# Patient Record
Sex: Male | Born: 1935 | Race: White | Hispanic: No | Marital: Married | Smoking: Former smoker
Health system: Southern US, Community
[De-identification: ages and names within clinical notes are randomized; demographics above are authoritative.]

## PROBLEM LIST (undated history)

## (undated) DIAGNOSIS — C801 Malignant (primary) neoplasm, unspecified: Secondary | ICD-10-CM

## (undated) DIAGNOSIS — E7800 Pure hypercholesterolemia, unspecified: Secondary | ICD-10-CM

## (undated) DIAGNOSIS — I10 Essential (primary) hypertension: Secondary | ICD-10-CM

---

## 2012-11-11 ENCOUNTER — Emergency Department (HOSPITAL_COMMUNITY)
Admission: EM | Admit: 2012-11-11 | Discharge: 2012-11-11 | Disposition: A | Discharge status: Home / Self Care | Payer: Medicare Other | Attending: Emergency Medicine | Admitting: Emergency Medicine

## 2012-11-11 ENCOUNTER — Encounter (HOSPITAL_COMMUNITY): Payer: Self-pay | Admitting: Emergency Medicine

## 2012-11-11 DIAGNOSIS — Z7982 Long term (current) use of aspirin: Secondary | ICD-10-CM | POA: Insufficient documentation

## 2012-11-11 DIAGNOSIS — I10 Essential (primary) hypertension: Secondary | ICD-10-CM | POA: Insufficient documentation

## 2012-11-11 DIAGNOSIS — Z880 Allergy status to penicillin: Secondary | ICD-10-CM | POA: Insufficient documentation

## 2012-11-11 DIAGNOSIS — R040 Epistaxis: Secondary | ICD-10-CM | POA: Insufficient documentation

## 2012-11-11 DIAGNOSIS — E7800 Pure hypercholesterolemia, unspecified: Secondary | ICD-10-CM | POA: Insufficient documentation

## 2012-11-11 DIAGNOSIS — Z79899 Other long term (current) drug therapy: Secondary | ICD-10-CM | POA: Insufficient documentation

## 2012-11-11 DIAGNOSIS — Z859 Personal history of malignant neoplasm, unspecified: Secondary | ICD-10-CM | POA: Insufficient documentation

## 2012-11-11 HISTORY — DX: Essential (primary) hypertension: I10

## 2012-11-11 HISTORY — DX: Pure hypercholesterolemia, unspecified: E78.00

## 2012-11-11 HISTORY — DX: Malignant (primary) neoplasm, unspecified: C80.1

## 2012-11-11 NOTE — ED Notes (Signed)
Pt ambulated to bathroom without difficulty, no epistaxis noted.

## 2012-11-11 NOTE — ED Notes (Signed)
States he started having a right sided nose bleed about 30 minutes prior to arrival.  Direct pressure instructions given to pt during triage and pressure applied to nose.

## 2012-11-11 NOTE — ED Provider Notes (Signed)
History  This chart was scribed for Angel Hutching, MD by Bennett Scrape, ED Scribe. This patient was seen in room APA01/APA01 and the patient's care was started at 6:40 PM.  CSN: [id number]  Arrival date & time 11/11/12  1751   First MD Initiated Contact with Patient 11/11/12 1810      Chief Complaint  Patient presents with  . Epistaxis     The history is provided by the patient. No language interpreter was used.    HPI Comments: Delbert Darley is a 79 y.o. male who presents to the Emergency Department complaining of one episode of sudden onset, gradually improving, constant, mild epistaxis from the right nare that started one hour ago while the pt was sitting. He has a tissue applying pressure to the nose with less than 25% blood coverage on the tissue. He denies swallowing any of the nosebleed. He denies trouble breathing or swallowing and nausea as associated symptoms. He has a h/o HTN, CA and HLD.   Past Medical History  Diagnosis Date  . High cholesterol   . Hypertension   . Cancer     History reviewed. No pertinent past surgical history.  No family history on file.  History  Substance Use Topics  . Smoking status: Not on file  . Smokeless tobacco: Not on file  . Alcohol Use: Not on file      Review of Systems  A complete 10 system review of systems was obtained and all systems are negative except as noted in the HPI and PMH.   Allergies  Penicillins  Home Medications   Current Outpatient Rx  Name  Route  Sig  Dispense  Refill  . aspirin EC 81 MG tablet   Oral   Take 81 mg by mouth every morning.         . cholecalciferol (VITAMIN D) 1000 UNITS tablet   Oral   Take 1,000 Units by mouth every morning.         . folic acid (FOLVITE) 800 MCG tablet   Oral   Take 800 mcg by mouth every morning.         Marland Kitchen ibuprofen (ADVIL,MOTRIN) 200 MG tablet   Oral   Take 200 mg by mouth daily as needed for pain.         . niacin 500 MG CR capsule   Oral  Take 500 mg by mouth at bedtime.         Marland Kitchen NIFEdipine (PROCARDIA-XL/ADALAT CC) 60 MG 24 hr tablet   Oral   Take 60 mg by mouth every morning.         . simvastatin (ZOCOR) 20 MG tablet   Oral   Take 20 mg by mouth every evening.           Triage Vitals: BP 166/82  Pulse 111  Resp 16  Ht 5\' 8"  (1.727 m)  Wt 162 lb (73.483 kg)  BMI 24.64 kg/m2  SpO2 96%  Physical Exam  Nursing note and vitals reviewed. Constitutional: He is oriented to person, place, and time. He appears well-developed and well-nourished. No distress.  HENT:  Head: Normocephalic and atraumatic.  Well established clot to the anterior lateral aspect to the right nare, left nare is clear, Ecchymosis to the corner of the right upper eyelid  Eyes: EOM are normal.  Neck: Neck supple. No tracheal deviation present.  Cardiovascular: Normal rate.   Pulmonary/Chest: Effort normal. No respiratory distress.  Musculoskeletal: Normal range of motion.  Neurological:  He is alert and oriented to person, place, and time.  Skin: Skin is warm and dry.  Psychiatric: He has a normal mood and affect. His behavior is normal.    ED Course  Procedures (including critical care time)  DIAGNOSTIC STUDIES: Oxygen Saturation is 96% on room air, normal by my interpretation.    COORDINATION OF CARE: 6:48 PM-Discussed treatment plan which includes applying ice to help slow the bleeding with pt at bedside and pt agreed to plan.   Labs Reviewed - No data to display No results found.   No diagnosis found.    MDM  Good hemostasis in right nostril.  No aggressive intervention necessary  I personally performed the services described in this documentation, which was scribed in my presence. The recorded information has been reviewed and is accurate.        Angel Hutching, MD 11/12/12 [phone]

## 2012-11-11 NOTE — ED Notes (Signed)
Pt presents with bilateral nose bleed x 1 hour. Bleeding controlled at this time.  Pt reports sitting at a production, when he felt his nose "dripping". Pt reports he has never had a nose bleed before. Denies hypertension at this time. NAD noted

## 2018-06-21 ENCOUNTER — Other Ambulatory Visit: Payer: Self-pay

## 2018-06-21 ENCOUNTER — Emergency Department (HOSPITAL_COMMUNITY)
Admission: EM | Admit: 2018-06-21 | Discharge: 2018-06-21 | Disposition: A | Discharge status: Home / Self Care | Payer: Medicare Other | Attending: Emergency Medicine | Admitting: Emergency Medicine

## 2018-06-21 ENCOUNTER — Encounter (HOSPITAL_COMMUNITY): Payer: Self-pay | Admitting: Emergency Medicine

## 2018-06-21 ENCOUNTER — Emergency Department (HOSPITAL_COMMUNITY): Payer: Medicare Other

## 2018-06-21 DIAGNOSIS — Z79899 Other long term (current) drug therapy: Secondary | ICD-10-CM | POA: Insufficient documentation

## 2018-06-21 DIAGNOSIS — I10 Essential (primary) hypertension: Secondary | ICD-10-CM | POA: Insufficient documentation

## 2018-06-21 DIAGNOSIS — R079 Chest pain, unspecified: Secondary | ICD-10-CM | POA: Diagnosis present

## 2018-06-21 DIAGNOSIS — R0789 Other chest pain: Secondary | ICD-10-CM | POA: Diagnosis not present

## 2018-06-21 DIAGNOSIS — Z87891 Personal history of nicotine dependence: Secondary | ICD-10-CM | POA: Insufficient documentation

## 2018-06-21 MED ORDER — ACETAMINOPHEN 325 MG PO TABS
ORAL_TABLET | ORAL | Status: AC
  Administered 2018-06-21: 650 mg via ORAL
  Filled 2018-06-21: qty 2

## 2018-06-21 MED ORDER — ACETAMINOPHEN 325 MG PO TABS: 650.0000 mg | ORAL_TABLET | Freq: Once | ORAL | Status: AC
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
## 2018-06-21 NOTE — Discharge Instructions (Signed)
You may take tylenol for pain. Go to your doctor or Return to the ED if you develop worsening chest pain or shortness of breath.

## 2019-08-11 ENCOUNTER — Other Ambulatory Visit: Payer: Self-pay

## 2019-08-11 ENCOUNTER — Ambulatory Visit
Admission: EM | Admit: 2019-08-11 | Discharge: 2019-08-11 | Disposition: A | Discharge status: Home / Self Care | Payer: Medicare Other | Attending: Emergency Medicine | Admitting: Emergency Medicine

## 2019-08-11 DIAGNOSIS — Z20822 Contact with and (suspected) exposure to covid-19: Secondary | ICD-10-CM

## 2019-08-11 DIAGNOSIS — U071 COVID-19: Secondary | ICD-10-CM

## 2019-08-11 LAB — POC SARS CORONAVIRUS 2 AG -  ED: SARS Coronavirus 2 Ag: POSITIVE — AB

## 2019-08-11 MED ORDER — BENZONATATE 100 MG PO CAPS: 100.0000 mg | ORAL_CAPSULE | Freq: Three times a day (TID) | ORAL | 0 refills | Status: DC

## 2019-08-11 MED ORDER — FLUTICASONE PROPIONATE 50 MCG/ACT NA SUSP: 1.0000 | Freq: Every day | NASAL | 0 refills | Status: AC

## 2019-08-11 MED ORDER — PREDNISONE 10 MG PO TABS: 20.0000 mg | ORAL_TABLET | Freq: Every day | ORAL | 0 refills | Status: DC

## 2019-08-11 NOTE — Discharge Instructions (Addendum)
POC COVID-19 test was positive In the meantime: You should remain isolated in your home for 10 days from symptom onset AND greater than 72 hours after symptoms resolution (absence of fever without the use of fever-reducing medication and improvement in respiratory symptoms), whichever is longer Get plenty of rest and push fluids Use medications daily for symptom relief Use OTC medications like ibuprofen or tylenol as needed fever or pain Call or go to the ED if you have any new or worsening symptoms such as fever, worsening cough, shortness of breath, chest tightness, chest pain, turning blue, changes in mental status, etc..Marland Kitchen

## 2019-08-11 NOTE — ED Triage Notes (Addendum)
Pt presents to UC w/ c/o congestion and productive cough x1week. Pt also states he's been having O2 sats from 88-92%. Pt requests covid test. Denies exposure.

## 2019-08-11 NOTE — ED Provider Notes (Signed)
[facility]    CSN: [id number] Arrival date & time: 08/11/19  1005      History   Chief Complaint Chief Complaint  Patient presents with  . congestion    HPI Angel Small is a 86 y.o. male.    Presented to the urgent care with a complaint of congestion, productive cough for the past week.  Denies sick exposure to COVID, flu or strep.  Denies recent travel.  Denies aggravating or alleviating symptoms.  Denies previous COVID infection.   Denies fever, chills, fatigue,  rhinorrhea, sore throat, SOB, wheezing, chest pain, nausea, vomiting, changes in bowel or bladder habits.    The history is provided by the patient. No language interpreter was used.    Past Medical History:  Diagnosis Date  . Cancer ([place])   . High cholesterol   . Hypertension     There are no problems to display for this patient.   History reviewed. No pertinent surgical history.     Home Medications    Prior to Admission medications   Medication Sig Start Date End Date Taking? Authorizing Provider  acetaminophen (TYLENOL) 500 MG tablet Take 500 mg by mouth daily as needed for mild pain or moderate pain.    [provider]  amLODipine (NORVASC) 10 MG tablet Take 10 mg by mouth daily. 03/16/18   [provider]  azelastine (OPTIVAR) 0.05 % ophthalmic solution Apply 1 drop to eye 2 (two) times daily as needed. 05/01/18   [provider]  benzonatate (TESSALON) 100 MG capsule Take 1 capsule (100 mg total) by mouth every 8 (eight) hours. 08/11/19   Ranita Stjulien, Darrelyn Hillock, FNP  cholecalciferol (VITAMIN D) 1000 UNITS tablet Take 1,000 Units by mouth every morning.    [provider]  fluticasone (FLONASE) 50 MCG/ACT nasal spray Place 1 spray into both nostrils daily for 14 days. 08/11/19 08/25/19  Khamari Yousuf, Darrelyn Hillock, FNP  folic acid (FOLVITE) Q000111Q MCG tablet Take 800 mcg by mouth every morning.    [provider]  hydrochlorothiazide (HYDRODIURIL) 25 MG tablet  Take 25 mg by mouth daily. 05/04/18   [provider]  Multiple Vitamins-Minerals (ICAPS) TABS Take 1 tablet by mouth 2 (two) times daily.    [provider]  predniSONE (DELTASONE) 10 MG tablet Take 2 tablets (20 mg total) by mouth daily. 08/11/19   Sherae Santino, Darrelyn Hillock, FNP  sertraline (ZOLOFT) 50 MG tablet Take 50 mg by mouth daily. 05/29/18   [provider]  simvastatin (ZOCOR) 20 MG tablet Take 20 mg by mouth every evening.    [provider]    Family History Family History  Problem Relation Age of Onset  . Healthy Mother   . Healthy Father     Social History Social History   Tobacco Use  . Smoking status: Former Research scientist (life sciences)  . Smokeless tobacco: Never Used  Substance Use Topics  . Alcohol use: Not Currently  . Drug use: Never     Allergies   Penicillins   Review of Systems Review of Systems  Constitutional: Negative.   HENT: Positive for congestion.   Respiratory: Positive for cough.   Cardiovascular: Negative.   Gastrointestinal: Negative.   Neurological: Negative.      Physical Exam Triage Vital Signs ED Triage Vitals  Enc Vitals Group     BP 08/11/19 1015 125/66     Pulse Rate 08/11/19 1015 82     Resp 08/11/19 1015 18     Temp 08/11/19 1015  98.3 F (36.8 C)     Temp Source 08/11/19 1015 Oral     SpO2 08/11/19 1015 90 %     Weight --      Height --      Head Circumference --      Peak Flow --      Pain Score 08/11/19 1018 0     Pain Loc --      Pain Edu? --      Excl. in [place]? --    No data found.  Updated Vital Signs BP 125/66 (BP Location: Right Arm)   Pulse 82   Temp 98.3 F (36.8 C) (Oral)   Resp 18   SpO2 90%   Visual Acuity Right Eye Distance:   Left Eye Distance:   Bilateral Distance:    Right Eye Near:   Left Eye Near:    Bilateral Near:     Physical Exam Vitals and nursing note reviewed.  Constitutional:      General: He is not in acute distress.    Appearance: Normal appearance. He is  normal weight. He is not ill-appearing or toxic-appearing.  HENT:     Head: Normocephalic.     Right Ear: Tympanic membrane, ear canal and external ear normal. There is no impacted cerumen.     Left Ear: Tympanic membrane, ear canal and external ear normal. There is no impacted cerumen.     Nose: Nose normal. No congestion.     Mouth/Throat:     Mouth: Mucous membranes are moist.     Pharynx: Oropharynx is clear. No oropharyngeal exudate or posterior oropharyngeal erythema.  Cardiovascular:     Rate and Rhythm: Normal rate and regular rhythm.     Pulses: Normal pulses.     Heart sounds: Normal heart sounds. No murmur.  Pulmonary:     Effort: Pulmonary effort is normal. No respiratory distress.     Breath sounds: Normal breath sounds. No wheezing or rhonchi.  Chest:     Chest wall: No tenderness.  Abdominal:     General: Abdomen is flat. Bowel sounds are normal. There is no distension.     Palpations: There is no mass.     Tenderness: There is no abdominal tenderness.  Skin:    Capillary Refill: Capillary refill takes less than 2 seconds.  Neurological:     General: No focal deficit present.     Mental Status: He is alert and oriented to person, place, and time.      UC Treatments / Results  Labs (all labs ordered are listed, but only abnormal results are displayed) Labs Reviewed  POC SARS CORONAVIRUS 2 AG -  ED - Abnormal; Notable for the following components:      Result Value   SARS Coronavirus 2 Ag Positive (*)    All other components within normal limits    EKG   Radiology No results found.  Procedures Procedures (including critical care time)  Medications Ordered in UC Medications - No data to display  Initial Impression / Assessment and Plan / UC Course  I have reviewed the triage vital signs and the nursing notes.  Pertinent labs & imaging results that were available during my care of the patient were reviewed by me and considered in my medical decision  making (see chart for details).    POC COVID-19 test was ordered.  Results were positive for COVID-19 infection Advised patient to quarantine. Tessalon Perles prescribed for cough. Flonase for congestion. Prednisone for  inflammation. Advised patient to go to ED for worsening of symptoms  Final Clinical Impressions(s) / UC Diagnoses   Final diagnoses:  COVID-19 virus infection     Discharge Instructions     POC COVID-19 test was positive In the meantime: You should remain isolated in your home for 10 days from symptom onset AND greater than 72 hours after symptoms resolution (absence of fever without the use of fever-reducing medication and improvement in respiratory symptoms), whichever is longer Get plenty of rest and push fluids Use medications daily for symptom relief Use OTC medications like ibuprofen or tylenol as needed fever or pain Call or go to the ED if you have any new or worsening symptoms such as fever, worsening cough, shortness of breath, chest tightness, chest pain, turning blue, changes in mental status, etc...     ED Prescriptions    Medication Sig Dispense Auth. Provider   predniSONE (DELTASONE) 10 MG tablet Take 2 tablets (20 mg total) by mouth daily. 15 tablet Janika Jedlicka, Darrelyn Hillock, FNP   benzonatate (TESSALON) 100 MG capsule Take 1 capsule (100 mg total) by mouth every 8 (eight) hours. 21 capsule Kerry Odonohue S, FNP   fluticasone (FLONASE) 50 MCG/ACT nasal spray Place 1 spray into both nostrils daily for 14 days. 16 g Emerson Monte, FNP     PDMP not reviewed this encounter.   Emerson Monte, FNP 08/11/19 1052

## 2019-08-12 ENCOUNTER — Telehealth: Payer: Self-pay | Admitting: Unknown Physician Specialty

## 2019-08-12 NOTE — Telephone Encounter (Signed)
Called r/e mab infusion for Covid 19.  Unable to reach pt at this number.

## 2019-08-13 ENCOUNTER — Other Ambulatory Visit: Payer: Self-pay | Admitting: Nurse Practitioner

## 2019-08-13 NOTE — Progress Notes (Signed)
  I connected by phone with Angel Small on 08/13/2019 at 2:23 PM to discuss the potential use of an new treatment for mild to moderate COVID-19 viral infection in non-hospitalized patients.  This patient is a 86 y.o. male that meets the FDA criteria for Emergency Use Authorization of bamlanivimab or casirivimab\imdevimab.  Has a (+) direct SARS-CoV-2 viral test result  Has mild or moderate COVID-19   Is ? 84 years of age and weighs ? 40 kg  Is NOT hospitalized due to COVID-19  Is NOT requiring oxygen therapy or requiring an increase in baseline oxygen flow rate due to COVID-19  Is within 10 days of symptom onset  Has at least one of the high risk factor(s) for progression to severe COVID-19 and/or hospitalization as defined in EUA.  Specific high risk criteria : >/= 84 yo   I have spoken and communicated the following to the patient or parent/caregiver:  1. FDA has authorized the emergency use of bamlanivimab and casirivimab\imdevimab for the treatment of mild to moderate COVID-19 in adults and pediatric patients with positive results of direct SARS-CoV-2 viral testing who are 28 years of age and older weighing at least 40 kg, and who are at high risk for progressing to severe COVID-19 and/or hospitalization.  2. The significant known and potential risks and benefits of bamlanivimab and casirivimab\imdevimab, and the extent to which such potential risks and benefits are unknown.  3. Information on available alternative treatments and the risks and benefits of those alternatives, including clinical trials.  4. Patients treated with bamlanivimab and casirivimab\imdevimab should continue to self-isolate and use infection control measures (e.g., wear mask, isolate, social distance, avoid sharing personal items, clean and disinfect "high touch" surfaces, and frequent handwashing) according to CDC guidelines.   5. The patient or parent/caregiver has the option to accept or refuse bamlanivimab  or casirivimab\imdevimab .  After reviewing this information with the patient, The patient agreed to proceed with receiving the bamlanimivab infusion and will be provided a copy of the Fact sheet prior to receiving the infusion.Fenton Foy 08/13/2019 2:23 PM

## 2019-08-14 ENCOUNTER — Ambulatory Visit (HOSPITAL_COMMUNITY)
Admission: RE | Admit: 2019-08-14 | Discharge: 2019-08-14 | Disposition: A | Discharge status: Home / Self Care | Payer: Medicare Other | Source: Ambulatory Visit | Attending: Pulmonary Disease | Admitting: Pulmonary Disease

## 2019-08-14 DIAGNOSIS — Z23 Encounter for immunization: Secondary | ICD-10-CM | POA: Diagnosis present

## 2019-08-14 DIAGNOSIS — U071 COVID-19: Secondary | ICD-10-CM | POA: Insufficient documentation

## 2019-08-14 MED ORDER — ALBUTEROL SULFATE HFA 108 (90 BASE) MCG/ACT IN AERS: 2.0000 | INHALATION_SPRAY | Freq: Once | RESPIRATORY_TRACT | Status: DC | PRN

## 2019-08-14 MED ORDER — METHYLPREDNISOLONE SODIUM SUCC 125 MG IJ SOLR: 125.0000 mg | Freq: Once | INTRAMUSCULAR | Status: DC | PRN

## 2019-08-14 MED ORDER — DIPHENHYDRAMINE HCL 50 MG/ML IJ SOLN: 50.0000 mg | Freq: Once | INTRAMUSCULAR | Status: DC | PRN

## 2019-08-14 MED ORDER — FAMOTIDINE IN NACL 20-0.9 MG/50ML-% IV SOLN: 20.0000 mg | Freq: Once | INTRAVENOUS | Status: DC | PRN

## 2019-08-14 MED ORDER — SODIUM CHLORIDE 0.9 % IV SOLN
700.0000 mg | Freq: Once | INTRAVENOUS | Status: AC
  Administered 2019-08-14: 09:00:00 700 mg via INTRAVENOUS
  Filled 2019-08-14: qty 20

## 2019-08-14 MED ORDER — EPINEPHRINE 0.3 MG/0.3ML IJ SOAJ: 0.3000 mg | Freq: Once | INTRAMUSCULAR | Status: DC | PRN

## 2019-08-14 MED ORDER — SODIUM CHLORIDE 0.9 % IV SOLN: INTRAVENOUS | Status: DC | PRN

## 2019-08-14 NOTE — Discharge Instructions (Signed)
What types of side effects do monoclonal antibody drugs cause?  Common side effects  In general, the more common side effects caused by monoclonal antibody drugs include: . Allergic reactions, such as hives or itching . Flu-like signs and symptoms, including chills, fatigue, fever, and muscle aches and pains . Nausea, vomiting . Diarrhea . Skin rashes Low blood pressure COVID-19 COVID-19 is a respiratory infection that is caused by a virus called severe acute respiratory syndrome coronavirus 2 (SARS-CoV-2). The disease is also known as coronavirus disease or novel coronavirus. In some people, the virus may not cause any symptoms. In others, it may cause a serious infection. The infection can get worse quickly and can lead to complications, such as: Pneumonia, or infection of the lungs. Acute respiratory distress syndrome or ARDS. This is a condition in which fluid build-up in the lungs prevents the lungs from filling with air and passing oxygen into the blood. Acute respiratory failure. This is a condition in which there is not enough oxygen passing from the lungs to the body or when carbon dioxide is not passing from the lungs out of the body. Sepsis or septic shock. This is a serious bodily reaction to an infection. Blood clotting problems. Secondary infections due to bacteria or fungus. Organ failure. This is when your body's organs stop working. The virus that causes COVID-19 is contagious. This means that it can spread from person to person through droplets from coughs and sneezes (respiratory secretions). What are the causes? This illness is caused by a virus. You may catch the virus by: Breathing in droplets from an infected person. Droplets can be spread by a person breathing, speaking, singing, coughing, or sneezing. Touching something, like a table or a doorknob, that was exposed to the virus (contaminated) and then touching your mouth, nose, or eyes. What increases the risk? Risk  for infection You are more likely to be infected with this virus if you: Are within 6 feet (2 meters) of a person with COVID-19. Provide care for or live with a person who is infected with COVID-19. Spend time in crowded indoor spaces or live in shared housing. Risk for serious illness You are more likely to become seriously ill from the virus if you: Are 10 years of age or older. The higher your age, the more you are at risk for serious illness. Live in a nursing home or long-term care facility. Have cancer. Have a long-term (chronic) disease such as: Chronic lung disease, including chronic obstructive pulmonary disease or asthma. A long-term disease that lowers your body's ability to fight infection (immunocompromised). Heart disease, including heart failure, a condition in which the arteries that lead to the heart become narrow or blocked (coronary artery disease), a disease which makes the heart muscle thick, weak, or stiff (cardiomyopathy). Diabetes. Chronic kidney disease. Sickle cell disease, a condition in which red blood cells have an abnormal "sickle" shape. Liver disease. Are obese. What are the signs or symptoms? Symptoms of this condition can range from mild to severe. Symptoms may appear any time from 2 to 14 days after being exposed to the virus. They include: A fever or chills. A cough. Difficulty breathing. Headaches, body aches, or muscle aches. Runny or stuffy (congested) nose. A sore throat. New loss of taste or smell. Some people may also have stomach problems, such as nausea, vomiting, or diarrhea. Other people may not have any symptoms of COVID-19. How is this diagnosed? This condition may be diagnosed based on: Your signs and  symptoms, especially if: You live in an area with a COVID-19 outbreak. You recently traveled to or from an area where the virus is common. You provide care for or live with a person who was diagnosed with COVID-19. You were exposed to  a person who was diagnosed with COVID-19. A physical exam. Lab tests, which may include: Taking a sample of fluid from the back of your nose and throat (nasopharyngeal fluid), your nose, or your throat using a swab. A sample of mucus from your lungs (sputum). Blood tests. Imaging tests, which may include, X-rays, CT scan, or ultrasound. How is this treated? At present, there is no medicine to treat COVID-19. Medicines that treat other diseases are being used on a trial basis to see if they are effective against COVID-19. Your health care provider will talk with you about ways to treat your symptoms. For most people, the infection is mild and can be managed at home with rest, fluids, and over-the-counter medicines. Treatment for a serious infection usually takes places in a hospital intensive care unit (ICU). It may include one or more of the following treatments. These treatments are given until your symptoms improve. Receiving fluids and medicines through an IV. Supplemental oxygen. Extra oxygen is given through a tube in the nose, a face mask, or a hood. Positioning you to lie on your stomach (prone position). This makes it easier for oxygen to get into the lungs. Continuous positive airway pressure (CPAP) or bi-level positive airway pressure (BPAP) machine. This treatment uses mild air pressure to keep the airways open. A tube that is connected to a motor delivers oxygen to the body. Ventilator. This treatment moves air into and out of the lungs by using a tube that is placed in your windpipe. Tracheostomy. This is a procedure to create a hole in the neck so that a breathing tube can be inserted. Extracorporeal membrane oxygenation (ECMO). This procedure gives the lungs a chance to recover by taking over the functions of the heart and lungs. It supplies oxygen to the body and removes carbon dioxide. Follow these instructions at home: Lifestyle If you are sick, stay home except to get medical  care. Your health care provider will tell you how long to stay home. Call your health care provider before you go for medical care. Rest at home as told by your health care provider. Do not use any products that contain nicotine or tobacco, such as cigarettes, e-cigarettes, and chewing tobacco. If you need help quitting, ask your health care provider. Return to your normal activities as told by your health care provider. Ask your health care provider what activities are safe for you. General instructions Take over-the-counter and prescription medicines only as told by your health care provider. Drink enough fluid to keep your urine pale yellow. Keep all follow-up visits as told by your health care provider. This is important. How is this prevented?  There is no vaccine to help prevent COVID-19 infection. However, there are steps you can take to protect yourself and others from this virus. To protect yourself:  Do not travel to areas where COVID-19 is a risk. The areas where COVID-19 is reported change often. To identify high-risk areas and travel restrictions, check the CDC travel website: FatFares.com.br If you live in, or must travel to, an area where COVID-19 is a risk, take precautions to avoid infection. Stay away from people who are sick. Wash your hands often with soap and water for 20 seconds. If soap and  water are not available, use an alcohol-based hand sanitizer. Avoid touching your mouth, face, eyes, or nose. Avoid going out in public, follow guidance from your state and local health authorities. If you must go out in public, wear a cloth face covering or face mask. Make sure your mask covers your nose and mouth. Avoid crowded indoor spaces. Stay at least 6 feet (2 meters) away from others. Disinfect objects and surfaces that are frequently touched every day. This may include: Counters and tables. Doorknobs and light switches. Sinks and faucets. Electronics, such as  phones, remote controls, keyboards, computers, and tablets. To protect others: If you have symptoms of COVID-19, take steps to prevent the virus from spreading to others. If you think you have a COVID-19 infection, contact your health care provider right away. Tell your health care team that you think you may have a COVID-19 infection. Stay home. Leave your house only to seek medical care. Do not use public transport. Do not travel while you are sick. Wash your hands often with soap and water for 20 seconds. If soap and water are not available, use alcohol-based hand sanitizer. Stay away from other members of your household. Let healthy household members care for children and pets, if possible. If you have to care for children or pets, wash your hands often and wear a mask. If possible, stay in your own room, separate from others. Use a different bathroom. Make sure that all people in your household wash their hands well and often. Cough or sneeze into a tissue or your sleeve or elbow. Do not cough or sneeze into your hand or into the air. Wear a cloth face covering or face mask. Make sure your mask covers your nose and mouth. Where to find more information Centers for Disease Control and Prevention: PurpleGadgets.be World Health Organization: https://www.castaneda.info/ Contact a health care provider if: You live in or have traveled to an area where COVID-19 is a risk and you have symptoms of the infection. You have had contact with someone who has COVID-19 and you have symptoms of the infection. Get help right away if: You have trouble breathing. You have pain or pressure in your chest. You have confusion. You have bluish lips and fingernails. You have difficulty waking from sleep. You have symptoms that get worse. These symptoms may represent a serious problem that is an emergency. Do not wait to see if the symptoms will go away. Get medical help right  away. Call your local emergency services (911 in the [place] Do not drive yourself to the hospital. Let the emergency medical personnel know if you think you have COVID-19. Summary COVID-19 is a respiratory infection that is caused by a virus. It is also known as coronavirus disease or novel coronavirus. It can cause serious infections, such as pneumonia, acute respiratory distress syndrome, acute respiratory failure, or sepsis. The virus that causes COVID-19 is contagious. This means that it can spread from person to person through droplets from breathing, speaking, singing, coughing, or sneezing. You are more likely to develop a serious illness if you are 26 years of age or older, have a weak immune system, live in a nursing home, or have chronic disease. There is no medicine to treat COVID-19. Your health care provider will talk with you about ways to treat your symptoms. Take steps to protect yourself and others from infection. Wash your hands often and disinfect objects and surfaces that are frequently touched every day. Stay away from people who are  sick and wear a mask if you are sick. This information is not intended to replace advice given to you by your health care provider. Make sure you discuss any questions you have with your health care provider. Document Revised: 04/27/2019 Document Reviewed: 08/03/2018 Elsevier Patient Education  2020 [place] is recommending patients who receive monoclonal antibody treatments wait at least 90 days before being vaccinated.  Currently, there are no data on the safety and efficacy of mRNA COVID-19 vaccines in persons who received monoclonal antibodies or convalescent plasma as part of COVID-19 treatment. Based on the estimated half-life of such therapies as well as evidence suggesting that reinfection is uncommon in the 90 days after initial infection, vaccination should be deferred for at least 90 days, as a precautionary measure until  additional information becomes available, to avoid interference of the antibody treatment with vaccine-induced immune responses.

## 2019-08-14 NOTE — Progress Notes (Signed)
Patient ID: Angel Small, male   DOB: 1928-06-13, 86 y.o.   MRN: [id number]  Diagnosis: [id number]  Physician:  Procedure: [facility] Med: bamlanivimab infusion - Provided patient with bamlanimivab fact sheet for patients, parents and caregivers prior to infusion.  Complications: No immediate complications noted.  Discharge: Discharged home   Heide Scales 08/14/2019
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
## 2021-05-19 ENCOUNTER — Encounter: Payer: Self-pay | Admitting: Emergency Medicine

## 2021-05-19 ENCOUNTER — Other Ambulatory Visit: Payer: Self-pay

## 2021-05-19 ENCOUNTER — Ambulatory Visit
Admission: EM | Admit: 2021-05-19 | Discharge: 2021-05-19 | Disposition: A | Discharge status: Home / Self Care | Payer: Medicare Other | Attending: Student | Admitting: Student

## 2021-05-19 DIAGNOSIS — J111 Influenza due to unidentified influenza virus with other respiratory manifestations: Secondary | ICD-10-CM

## 2021-05-19 DIAGNOSIS — Z20822 Contact with and (suspected) exposure to covid-19: Secondary | ICD-10-CM

## 2021-05-19 MED ORDER — PREDNISONE 20 MG PO TABS: 20.0000 mg | ORAL_TABLET | Freq: Every day | ORAL | 0 refills | Status: AC

## 2021-05-19 MED ORDER — OSELTAMIVIR PHOSPHATE 75 MG PO CAPS: 75.0000 mg | ORAL_CAPSULE | Freq: Two times a day (BID) | ORAL | 0 refills | Status: AC

## 2021-05-19 MED ORDER — ONDANSETRON 8 MG PO TBDP: 8.0000 mg | ORAL_TABLET | Freq: Three times a day (TID) | ORAL | 0 refills | Status: AC | PRN

## 2021-05-19 MED ORDER — BENZONATATE 100 MG PO CAPS: 100.0000 mg | ORAL_CAPSULE | Freq: Three times a day (TID) | ORAL | 0 refills | Status: AC

## 2021-05-19 MED ORDER — ALBUTEROL SULFATE HFA 108 (90 BASE) MCG/ACT IN AERS: 1.0000 | INHALATION_SPRAY | Freq: Four times a day (QID) | RESPIRATORY_TRACT | 0 refills | Status: AC | PRN

## 2021-05-19 NOTE — ED Provider Notes (Signed)
[facility]    CSN: [id number] Arrival date & time: 05/19/21  0827      History   Chief Complaint No chief complaint on file.   HPI Angel Small is a 87 y.o. male presenting with viral syndrome for 2 days following exposure to the flu.  Medical history hypertension.  Describes 2 days of nonproductive cough, nasal congestion. Tolerating fluids and food. Denies SOB, CP. Has not taken medications for the symptoms today. Tolerating fluids and foods.  HPI  Past Medical History:  Diagnosis Date   Cancer ([place])    High cholesterol    Hypertension     There are no problems to display for this patient.   History reviewed. No pertinent surgical history.     Home Medications    Prior to Admission medications   Medication Sig Start Date End Date Taking? Authorizing Provider  albuterol (VENTOLIN HFA) 108 (90 Base) MCG/ACT inhaler Inhale 1-2 puffs into the lungs every 6 (six) hours as needed for wheezing or shortness of breath. 05/19/21  Yes Hazel Sams, PA-C  benzonatate (TESSALON) 100 MG capsule Take 1 capsule (100 mg total) by mouth every 8 (eight) hours. 05/19/21  Yes Hazel Sams, PA-C  ondansetron (ZOFRAN ODT) 8 MG disintegrating tablet Take 1 tablet (8 mg total) by mouth every 8 (eight) hours as needed for nausea or vomiting. 05/19/21  Yes Hazel Sams, PA-C  oseltamivir (TAMIFLU) 75 MG capsule Take 1 capsule (75 mg total) by mouth every 12 (twelve) hours. 05/19/21  Yes Hazel Sams, PA-C  predniSONE (DELTASONE) 20 MG tablet Take 1 tablet (20 mg total) by mouth daily for 5 days. 05/19/21 05/24/21 Yes Hazel Sams, PA-C  acetaminophen (TYLENOL) 500 MG tablet Take 500 mg by mouth daily as needed for mild pain or moderate pain.    [provider]  amLODipine (NORVASC) 10 MG tablet Take 10 mg by mouth daily. 03/16/18   [provider]  azelastine (OPTIVAR) 0.05 % ophthalmic solution Apply 1 drop to eye 2 (two) times daily as needed. 05/01/18    [provider]  cholecalciferol (VITAMIN D) 1000 UNITS tablet Take 1,000 Units by mouth every morning.    [provider]  fluticasone (FLONASE) 50 MCG/ACT nasal spray Place 1 spray into both nostrils daily for 14 days. 08/11/19 08/25/19  Avegno, Darrelyn Hillock, FNP  folic acid (FOLVITE) 390 MCG tablet Take 800 mcg by mouth every morning.    [provider]  hydrochlorothiazide (HYDRODIURIL) 25 MG tablet Take 25 mg by mouth daily. 05/04/18   [provider]  Multiple Vitamins-Minerals (ICAPS) TABS Take 1 tablet by mouth 2 (two) times daily.    [provider]  sertraline (ZOLOFT) 50 MG tablet Take 50 mg by mouth daily. 05/29/18   [provider]  simvastatin (ZOCOR) 20 MG tablet Take 20 mg by mouth every evening.    [provider]    Family History Family History  Problem Relation Age of Onset   Healthy Mother    Healthy Father     Social History Social History   Tobacco Use   Smoking status: Former   Smokeless tobacco: Never  Substance Use Topics   Alcohol use: Not Currently   Drug use: Never     Allergies   Penicillins   Review of Systems Review of Systems  Constitutional:  Negative for appetite change, chills and fever.  HENT:  Positive for congestion. Negative for ear pain, rhinorrhea, sinus pressure, sinus  pain and sore throat.   Eyes:  Negative for redness and visual disturbance.  Respiratory:  Positive for cough. Negative for chest tightness, shortness of breath and wheezing.   Cardiovascular:  Negative for chest pain and palpitations.  Gastrointestinal:  Negative for abdominal pain, constipation, diarrhea, nausea and vomiting.  Genitourinary:  Negative for dysuria, frequency and urgency.  Musculoskeletal:  Negative for myalgias.  Neurological:  Negative for dizziness, weakness and headaches.  Psychiatric/Behavioral:  Negative for confusion.   All other systems reviewed and are negative.   Physical  Exam Triage Vital Signs ED Triage Vitals  Enc Vitals Group     BP 05/19/21 0859 (!) 157/69     Pulse Rate 05/19/21 0859 89     Resp 05/19/21 0859 18     Temp 05/19/21 0859 (!) 97.4 F (36.3 C)     Temp Source 05/19/21 0859 Oral     SpO2 05/19/21 0859 90 %     Weight --      Height --      Head Circumference --      Peak Flow --      Pain Score 05/19/21 0858 0     Pain Loc --      Pain Edu? --      Excl. in [place]? --    No data found.  Updated Vital Signs BP (!) 157/69 (BP Location: Right Arm)   Pulse 89   Temp (!) 97.4 F (36.3 C) (Oral)   Resp 18   SpO2 90%   Visual Acuity Right Eye Distance:   Left Eye Distance:   Bilateral Distance:    Right Eye Near:   Left Eye Near:    Bilateral Near:     Physical Exam Vitals reviewed.  Constitutional:      General: He is not in acute distress.    Appearance: Normal appearance. He is not ill-appearing.  HENT:     Head: Normocephalic and atraumatic.     Right Ear: Tympanic membrane, ear canal and external ear normal. No tenderness. No middle ear effusion. There is no impacted cerumen. Tympanic membrane is not perforated, erythematous, retracted or bulging.     Left Ear: Tympanic membrane, ear canal and external ear normal. No tenderness.  No middle ear effusion. There is no impacted cerumen. Tympanic membrane is not perforated, erythematous, retracted or bulging.     Nose: Nose normal. No congestion.     Mouth/Throat:     Mouth: Mucous membranes are moist.     Pharynx: Uvula midline. No oropharyngeal exudate or posterior oropharyngeal erythema.  Eyes:     Extraocular Movements: Extraocular movements intact.     Pupils: Pupils are equal, round, and reactive to light.  Cardiovascular:     Rate and Rhythm: Normal rate and regular rhythm.     Heart sounds: Normal heart sounds.  Pulmonary:     Effort: Pulmonary effort is normal.     Breath sounds: Wheezing present. No decreased breath sounds, rhonchi or rales.     Comments:  Few expiratory wheezes throughout Abdominal:     Palpations: Abdomen is soft.     Tenderness: There is no abdominal tenderness. There is no guarding or rebound.  Musculoskeletal:     Right lower leg: No edema.     Left lower leg: No edema.  Lymphadenopathy:     Cervical: No cervical adenopathy.     Right cervical: No superficial cervical adenopathy.    Left cervical: No superficial cervical adenopathy.  Neurological:     General: No focal deficit present.     Mental Status: He is alert and oriented to person, place, and time.  Psychiatric:        Mood and Affect: Mood normal.        Behavior: Behavior normal.        Thought Content: Thought content normal.        Judgment: Judgment normal.     UC Treatments / Results  Labs (all labs ordered are listed, but only abnormal results are displayed) Labs Reviewed  COVID-19, FLU A+B NAA    EKG   Radiology No results found.  Procedures Procedures (including critical care time)  Medications Ordered in UC Medications - No data to display  Initial Impression / Assessment and Plan / UC Course  I have reviewed the triage vital signs and the nursing notes.  Pertinent labs & imaging results that were available during my care of the patient were reviewed by me and considered in my medical decision making (see chart for details).     This patient is a very pleasant 87 y.o. year old male presenting with influenza following exposure to this. Today this pt is afebrile nontachycardic nontachypneic, oxygenating well on room air, no wheezes rhonchi or rales. Has not taken antipyretic today.  COVID and influenza PCR sent. Tamiflu sent. Also sent prednisone, tessalon, albuterol.   ED return precautions discussed. Patient verbalizes understanding and agreement.   .   Final Clinical Impressions(s) / UC Diagnoses   Final diagnoses:  Exposure to COVID-19 virus  Exposure to confirmed case of severe acute respiratory syndrome  coronavirus 2 (SARS-CoV-2) infection  Influenza with respiratory manifestation     Discharge Instructions      -Tamiflu twice daily x5 days. This medication can cause nausea, so I also sent nausea medication. You can stop the Tamiflu if you don't like it or if it causes side effects.  -Take the Zofran (ondansetron) up to 3 times daily for nausea and vomiting. -Tessalon (Benzonatate) as needed for cough. Take one pill up to 3x daily (every 8 hours) -Albuterol inhaler as needed for cough, wheezing, shortness of breath, 1 to 2 puffs every 6 hours as needed. -prednisone one pill daily x5 days -For fevers/chills, bodyaches, headaches- You can take Tylenol up to 1000 mg 3 times daily, and ibuprofen up to 600 mg 3 times daily with food.  You can take these together, or alternate every 3-4 hours. -Drink plenty of water/gatorade and get plenty of rest -With a virus, you're typically contagious for 5-7 days, or as long as you're having fevers.  -Come back and see [place] if things are getting worse instead of better, like shortness of breath, chest pain, fevers and chills that are getting higher instead of lower and do not come down with Tylenol or ibuprofen, etc.    ED Prescriptions     Medication Sig Dispense Auth. Provider   predniSONE (DELTASONE) 20 MG tablet Take 1 tablet (20 mg total) by mouth daily for 5 days. 5 tablet Hazel Sams, PA-C   benzonatate (TESSALON) 100 MG capsule Take 1 capsule (100 mg total) by mouth every 8 (eight) hours. 21 capsule Hazel Sams, PA-C   albuterol (VENTOLIN HFA) 108 (90 Base) MCG/ACT inhaler Inhale 1-2 puffs into the lungs every 6 (six) hours as needed for wheezing or shortness of breath. 1 each Hazel Sams, PA-C   oseltamivir (TAMIFLU) 75 MG capsule Take 1 capsule (75 mg total) by  mouth every 12 (twelve) hours. 10 capsule Marin Roberts E, PA-C   ondansetron (ZOFRAN ODT) 8 MG disintegrating tablet Take 1 tablet (8 mg total) by mouth every 8 (eight) hours  as needed for nausea or vomiting. 20 tablet Hazel Sams, PA-C      PDMP not reviewed this encounter.   Hazel Sams, PA-C 05/19/21 [phone]

## 2021-05-19 NOTE — Discharge Instructions (Signed)
-  Tamiflu twice daily x5 days. This medication can cause nausea, so I also sent nausea medication. You can stop the Tamiflu if you don't like it or if it causes side effects.  -Take the Zofran (ondansetron) up to 3 times daily for nausea and vomiting. -Tessalon (Benzonatate) as needed for cough. Take one pill up to 3x daily (every 8 hours) -Albuterol inhaler as needed for cough, wheezing, shortness of breath, 1 to 2 puffs every 6 hours as needed. -prednisone one pill daily x5 days -For fevers/chills, bodyaches, headaches- You can take Tylenol up to 1000 mg 3 times daily, and ibuprofen up to 600 mg 3 times daily with food.  You can take these together, or alternate every 3-4 hours. -Drink plenty of water/gatorade and get plenty of rest -With a virus, you're typically contagious for 5-7 days, or as long as you're having fevers.  -Come back and see [place] if things are getting worse instead of better, like shortness of breath, chest pain, fevers and chills that are getting higher instead of lower and do not come down with Tylenol or ibuprofen, etc.

## 2021-05-19 NOTE — ED Triage Notes (Signed)
Cough, runny nose and congestion since yesterday

## 2021-05-21 LAB — COVID-19, FLU A+B NAA
Influenza A, NAA: DETECTED — AB
Influenza B, NAA: NOT DETECTED
SARS-CoV-2, NAA: NOT DETECTED

## 2023-05-14 ENCOUNTER — Ambulatory Visit
Admission: EM | Admit: 2023-05-14 | Discharge: 2023-05-14 | Disposition: A | Discharge status: Home / Self Care | Payer: Medicare Other | Attending: Family Medicine | Admitting: Family Medicine

## 2023-05-14 ENCOUNTER — Encounter: Payer: Self-pay | Admitting: Emergency Medicine

## 2023-05-14 ENCOUNTER — Other Ambulatory Visit: Payer: Self-pay

## 2023-05-14 DIAGNOSIS — J22 Unspecified acute lower respiratory infection: Secondary | ICD-10-CM

## 2023-05-14 MED ORDER — BENZONATATE 100 MG PO CAPS: 100.0000 mg | ORAL_CAPSULE | Freq: Three times a day (TID) | ORAL | 0 refills | Status: AC

## 2023-05-14 MED ORDER — AZITHROMYCIN 250 MG PO TABS: ORAL_TABLET | ORAL | 0 refills | Status: AC

## 2023-05-14 MED ORDER — GUAIFENESIN ER 600 MG PO TB12: 600.0000 mg | ORAL_TABLET | Freq: Two times a day (BID) | ORAL | 0 refills | Status: AC | PRN

## 2023-05-14 NOTE — ED Provider Notes (Signed)
[facility]    CSN: [id number] Arrival date & time: 05/14/23  0807      History   Chief Complaint Chief Complaint  Patient presents with   Cough    HPI Angel Small is a 89 y.o. male.   Patient presenting today with 4-day history of progressively worsening productive cough of thick sputum, chest congestion, shortness of breath, nasal congestion, body aches.  Denies fever, chest pain, abdominal pain, nausea vomiting or diarrhea.  Taking Tylenol with minimal relief of symptoms.  No known history of chronic pulmonary disease.  No known sick contacts recently.    Past Medical History:  Diagnosis Date   Cancer (HCC)    High cholesterol    Hypertension     There are no problems to display for this patient.   History reviewed. No pertinent surgical history.     Home Medications    Prior to Admission medications   Medication Sig Start Date End Date Taking? Authorizing Provider  azithromycin (ZITHROMAX) 250 MG tablet Take first 2 tablets together, then 1 every day until finished. 05/14/23  Yes Particia Nearing, PA-C  benzonatate (TESSALON) 100 MG capsule Take 1 capsule (100 mg total) by mouth every 8 (eight) hours. 05/14/23  Yes Particia Nearing, PA-C  guaiFENesin (MUCINEX) 600 MG 12 hr tablet Take 1 tablet (600 mg total) by mouth 2 (two) times daily as needed. 05/14/23  Yes Particia Nearing, PA-C  acetaminophen (TYLENOL) 500 MG tablet Take 500 mg by mouth daily as needed for mild pain or moderate pain.    [provider]  albuterol (VENTOLIN HFA) 108 (90 Base) MCG/ACT inhaler Inhale 1-2 puffs into the lungs every 6 (six) hours as needed for wheezing or shortness of breath. 05/19/21   Rhys Martini, PA-C  amLODipine (NORVASC) 10 MG tablet Take 10 mg by mouth daily. 03/16/18   [provider]  azelastine (OPTIVAR) 0.05 % ophthalmic solution Apply 1 drop to eye 2 (two) times daily as needed. 05/01/18   [provider]   benzonatate (TESSALON) 100 MG capsule Take 1 capsule (100 mg total) by mouth every 8 (eight) hours. 05/19/21   Rhys Martini, PA-C  cholecalciferol (VITAMIN D) 1000 UNITS tablet Take 1,000 Units by mouth every morning.    [provider]  fluticasone (FLONASE) 50 MCG/ACT nasal spray Place 1 spray into both nostrils daily for 14 days. 08/11/19 08/25/19  Avegno, Zachery Dakins, FNP  folic acid (FOLVITE) 800 MCG tablet Take 800 mcg by mouth every morning.    [provider]  hydrochlorothiazide (HYDRODIURIL) 25 MG tablet Take 25 mg by mouth daily. 05/04/18   [provider]  Multiple Vitamins-Minerals (ICAPS) TABS Take 1 tablet by mouth 2 (two) times daily.    [provider]  ondansetron (ZOFRAN ODT) 8 MG disintegrating tablet Take 1 tablet (8 mg total) by mouth every 8 (eight) hours as needed for nausea or vomiting. 05/19/21   Rhys Martini, PA-C  oseltamivir (TAMIFLU) 75 MG capsule Take 1 capsule (75 mg total) by mouth every 12 (twelve) hours. 05/19/21   Rhys Martini, PA-C  sertraline (ZOLOFT) 50 MG tablet Take 50 mg by mouth daily. 05/29/18   [provider]  simvastatin (ZOCOR) 20 MG tablet Take 20 mg by mouth every evening.    [provider]    Family History Family History  Problem Relation Age of Onset   Healthy Mother    Healthy Father     Social  History Social History   Tobacco Use   Smoking status: Former   Smokeless tobacco: Never  Substance Use Topics   Alcohol use: Not Currently   Drug use: Never     Allergies   Penicillins   Review of Systems Review of Systems Per HPI  Physical Exam Triage Vital Signs ED Triage Vitals  Encounter Vitals Group     BP 05/14/23 0816 (!) 149/77     Systolic BP Percentile --      Diastolic BP Percentile --      Pulse Rate 05/14/23 0816 (!) 102     Resp 05/14/23 0816 (!) 22     Temp 05/14/23 0816 98.1 F (36.7 C)     Temp Source 05/14/23 0816 Oral     SpO2 05/14/23 0816 93  %     Weight --      Height --      Head Circumference --      Peak Flow --      Pain Score 05/14/23 0815 4     Pain Loc --      Pain Education --      Exclude from Growth Chart --    No data found.  Updated Vital Signs BP (!) 149/77 (BP Location: Right Arm)   Pulse (!) 102   Temp 98.1 F (36.7 C) (Oral)   Resp (!) 22   SpO2 93%   Visual Acuity Right Eye Distance:   Left Eye Distance:   Bilateral Distance:    Right Eye Near:   Left Eye Near:    Bilateral Near:     Physical Exam Vitals and nursing note reviewed.  Constitutional:      Appearance: He is well-developed.  HENT:     Head: Atraumatic.     Right Ear: External ear normal.     Left Ear: External ear normal.     Nose: Congestion present.     Mouth/Throat:     Pharynx: No oropharyngeal exudate.  Eyes:     Conjunctiva/sclera: Conjunctivae normal.     Pupils: Pupils are equal, round, and reactive to light.  Cardiovascular:     Rate and Rhythm: Normal rate and regular rhythm.  Pulmonary:     Effort: Pulmonary effort is normal. No respiratory distress.     Breath sounds: Rales present. No wheezing.  Musculoskeletal:        General: Normal range of motion.     Cervical back: Normal range of motion and neck supple.  Lymphadenopathy:     Cervical: No cervical adenopathy.  Skin:    General: Skin is warm and dry.  Neurological:     Mental Status: He is alert and oriented to person, place, and time.  Psychiatric:        Behavior: Behavior normal.      UC Treatments / Results  Labs (all labs ordered are listed, but only abnormal results are displayed) Labs Reviewed - No data to display  EKG   Radiology No results found.  Procedures Procedures (including critical care time)  Medications Ordered in UC Medications - No data to display  Initial Impression / Assessment and Plan / UC Course  I have reviewed the triage vital signs and the nursing notes.  Pertinent labs & imaging results that were  available during my care of the patient were reviewed by me and considered in my medical decision making (see chart for details).     Mildly hypertensive, tachycardic and tachypneic in triage, oxygen  saturation 93% on room air.  He is speaking in full sentences and appears in no acute distress but given symptoms and worsening course will cover with Zithromax, Mucinex, Tessalon for lower respiratory infection.  Discussed supportive over-the-counter medications, home care and return precautions.  Final Clinical Impressions(s) / UC Diagnoses   Final diagnoses:  Lower respiratory infection   Discharge Instructions   None    ED Prescriptions     Medication Sig Dispense Auth. Provider   azithromycin (ZITHROMAX) 250 MG tablet Take first 2 tablets together, then 1 every day until finished. 6 tablet Particia Nearing, PA-C   guaiFENesin (MUCINEX) 600 MG 12 hr tablet Take 1 tablet (600 mg total) by mouth 2 (two) times daily as needed. 20 tablet Particia Nearing, [place]   benzonatate (TESSALON) 100 MG capsule Take 1 capsule (100 mg total) by mouth every 8 (eight) hours. 21 capsule Particia Nearing, [place]      PDMP not reviewed this encounter.   Particia Nearing, [place] 05/14/23 1051

## 2023-05-14 NOTE — ED Triage Notes (Signed)
Pt reports productive cough, chest congestion, runny nose, body aches x4 days.

## 2023-09-08 ENCOUNTER — Other Ambulatory Visit: Payer: Self-pay

## 2023-09-08 ENCOUNTER — Encounter: Payer: Self-pay | Admitting: Emergency Medicine

## 2023-09-08 ENCOUNTER — Ambulatory Visit
Admission: EM | Admit: 2023-09-08 | Discharge: 2023-09-08 | Disposition: A | Discharge status: Home / Self Care | Payer: Self-pay | Attending: Family Medicine | Admitting: Family Medicine

## 2023-09-08 DIAGNOSIS — J209 Acute bronchitis, unspecified: Secondary | ICD-10-CM

## 2023-09-08 MED ORDER — GUAIFENESIN ER 600 MG PO TB12: 600.0000 mg | ORAL_TABLET | Freq: Two times a day (BID) | ORAL | 0 refills | Status: AC

## 2023-09-08 MED ORDER — BENZONATATE 200 MG PO CAPS: 200.0000 mg | ORAL_CAPSULE | Freq: Three times a day (TID) | ORAL | 0 refills | Status: AC | PRN

## 2023-09-08 MED ORDER — PREDNISONE 20 MG PO TABS: 40.0000 mg | ORAL_TABLET | Freq: Every day | ORAL | 0 refills | Status: AC

## 2023-09-08 NOTE — ED Triage Notes (Signed)
 Pt reports right sided chest tenderness x3 days. Pt reports has chronic cough but reports "color changes from dark grey to yellow". Denies any known fall or fevers.

## 2023-09-12 NOTE — ED Provider Notes (Signed)
 [facility]    CSN: [id number] Arrival date & time: 09/08/23  1706      History   Chief Complaint Chief Complaint  Patient presents with   Cough    HPI Angel Small is a 89 y.o. male.   Presenting today with 3-day history of chest tenderness, ongoing productive cough worse the past 3 days, fatigue.  Denies shortness of breath, abdominal pain, vomiting, diarrhea.  So far not tried anything over-the-counter for symptoms.  No known history of chronic pulmonary disease but is a former cigarette smoker.    Past Medical History:  Diagnosis Date   Cancer (HCC)    High cholesterol    Hypertension     There are no active problems to display for this patient.   History reviewed. No pertinent surgical history.     Home Medications    Prior to Admission medications   Medication Sig Start Date End Date Taking? Authorizing Provider  benzonatate (TESSALON) 200 MG capsule Take 1 capsule (200 mg total) by mouth 3 (three) times daily as needed for cough. 09/08/23  Yes Particia Nearing, PA-C  guaiFENesin (MUCINEX) 600 MG 12 hr tablet Take 1 tablet (600 mg total) by mouth 2 (two) times daily. 09/08/23  Yes Particia Nearing, PA-C  predniSONE (DELTASONE) 20 MG tablet Take 2 tablets (40 mg total) by mouth daily with breakfast. 09/08/23  Yes Particia Nearing, PA-C  acetaminophen (TYLENOL) 500 MG tablet Take 500 mg by mouth daily as needed for mild pain or moderate pain.    [provider]  albuterol (VENTOLIN HFA) 108 (90 Base) MCG/ACT inhaler Inhale 1-2 puffs into the lungs every 6 (six) hours as needed for wheezing or shortness of breath. 05/19/21   Rhys Martini, PA-C  amLODipine (NORVASC) 10 MG tablet Take 10 mg by mouth daily. 03/16/18   [provider]  azelastine (OPTIVAR) 0.05 % ophthalmic solution Apply 1 drop to eye 2 (two) times daily as needed. 05/01/18   [provider]  azithromycin (ZITHROMAX) 250 MG tablet Take first 2  tablets together, then 1 every day until finished. 05/14/23   Particia Nearing, PA-C  benzonatate (TESSALON) 100 MG capsule Take 1 capsule (100 mg total) by mouth every 8 (eight) hours. 05/19/21   Rhys Martini, PA-C  benzonatate (TESSALON) 100 MG capsule Take 1 capsule (100 mg total) by mouth every 8 (eight) hours. 05/14/23   Particia Nearing, PA-C  cholecalciferol (VITAMIN D) 1000 UNITS tablet Take 1,000 Units by mouth every morning.    [provider]  fluticasone (FLONASE) 50 MCG/ACT nasal spray Place 1 spray into both nostrils daily for 14 days. 08/11/19 08/25/19  Avegno, Zachery Dakins, FNP  folic acid (FOLVITE) 800 MCG tablet Take 800 mcg by mouth every morning.    [provider]  guaiFENesin (MUCINEX) 600 MG 12 hr tablet Take 1 tablet (600 mg total) by mouth 2 (two) times daily as needed. 05/14/23   Particia Nearing, PA-C  hydrochlorothiazide (HYDRODIURIL) 25 MG tablet Take 25 mg by mouth daily. 05/04/18   [provider]  Multiple Vitamins-Minerals (ICAPS) TABS Take 1 tablet by mouth 2 (two) times daily.    [provider]  ondansetron (ZOFRAN ODT) 8 MG disintegrating tablet Take 1 tablet (8 mg total) by mouth every 8 (eight) hours as needed for nausea or vomiting. 05/19/21   Rhys Martini, PA-C  oseltamivir (TAMIFLU) 75 MG capsule Take 1 capsule (75 mg total) by mouth every  12 (twelve) hours. 05/19/21   Rhys Martini, PA-C  sertraline (ZOLOFT) 50 MG tablet Take 50 mg by mouth daily. 05/29/18   [provider]  simvastatin (ZOCOR) 20 MG tablet Take 20 mg by mouth every evening.    [provider]    Family History Family History  Problem Relation Age of Onset   Healthy Mother    Healthy Father     Social History Social History   Tobacco Use   Smoking status: Former   Smokeless tobacco: Never  Substance Use Topics   Alcohol use: Not Currently   Drug use: Never     Allergies   Penicillins   Review of  Systems Review of Systems Per HPI  Physical Exam Triage Vital Signs ED Triage Vitals  Encounter Vitals Group     BP 09/08/23 1800 133/71     Systolic BP Percentile --      Diastolic BP Percentile --      Pulse Rate 09/08/23 1800 78     Resp 09/08/23 1800 20     Temp 09/08/23 1800 98 F (36.7 C)     Temp Source 09/08/23 1800 Oral     SpO2 09/08/23 1800 91 %     Weight --      Height --      Head Circumference --      Peak Flow --      Pain Score 09/08/23 1759 0     Pain Loc --      Pain Education --      Exclude from Growth Chart --    No data found.  Updated Vital Signs BP 133/71 (BP Location: Right Arm)   Pulse 78   Temp 98 F (36.7 C) (Oral)   Resp 20   SpO2 91%   Visual Acuity Right Eye Distance:   Left Eye Distance:   Bilateral Distance:    Right Eye Near:   Left Eye Near:    Bilateral Near:     Physical Exam Vitals and nursing note reviewed.  Constitutional:      Appearance: He is well-developed.  HENT:     Head: Atraumatic.     Right Ear: External ear normal.     Left Ear: External ear normal.     Nose: Rhinorrhea present.     Mouth/Throat:     Pharynx: Posterior oropharyngeal erythema present. No oropharyngeal exudate.  Eyes:     Conjunctiva/sclera: Conjunctivae normal.     Pupils: Pupils are equal, round, and reactive to light.  Cardiovascular:     Rate and Rhythm: Normal rate and regular rhythm.  Pulmonary:     Effort: Pulmonary effort is normal. No respiratory distress.     Breath sounds: Wheezing present. No rales.  Musculoskeletal:        General: Normal range of motion.     Cervical back: Normal range of motion and neck supple.  Lymphadenopathy:     Cervical: No cervical adenopathy.  Skin:    General: Skin is warm and dry.  Neurological:     Mental Status: He is alert and oriented to person, place, and time.  Psychiatric:        Behavior: Behavior normal.      UC Treatments / Results  Labs (all labs ordered are listed, but  only abnormal results are displayed) Labs Reviewed - No data to display  EKG   Radiology No results found.  Procedures Procedures (including critical care time)  Medications Ordered  in UC Medications - No data to display  Initial Impression / Assessment and Plan / UC Course  I have reviewed the triage vital signs and the nursing notes.  Pertinent labs & imaging results that were available during my care of the patient were reviewed by me and considered in my medical decision making (see chart for details).     Vitals and exam overall reassuring today, will treat for viral bronchitis with prednisone, Mucinex, Tessalon.  Discussed supportive over-the-counter medications, home care and return precautions.  Final Clinical Impressions(s) / UC Diagnoses   Final diagnoses:  Acute bronchitis, unspecified organism   Discharge Instructions   None    ED Prescriptions     Medication Sig Dispense Auth. Provider   predniSONE (DELTASONE) 20 MG tablet Take 2 tablets (40 mg total) by mouth daily with breakfast. 10 tablet Particia Nearing, PA-C   benzonatate (TESSALON) 200 MG capsule Take 1 capsule (200 mg total) by mouth 3 (three) times daily as needed for cough. 20 capsule Particia Nearing, PA-C   guaiFENesin (MUCINEX) 600 MG 12 hr tablet Take 1 tablet (600 mg total) by mouth 2 (two) times daily. 20 tablet Particia Nearing, [place]      PDMP not reviewed this encounter.   Roosvelt Maser [place], [place] 09/12/23 [phone]

## 2024-06-11 ENCOUNTER — Telehealth: Payer: Self-pay | Admitting: Nurse Practitioner

## 2024-06-11 ENCOUNTER — Ambulatory Visit (INDEPENDENT_AMBULATORY_CARE_PROVIDER_SITE_OTHER)

## 2024-06-11 ENCOUNTER — Ambulatory Visit
Admission: EM | Admit: 2024-06-11 | Discharge: 2024-06-11 | Disposition: A | Discharge status: Home / Self Care | Attending: Nurse Practitioner | Admitting: Nurse Practitioner

## 2024-06-11 DIAGNOSIS — R10A2 Flank pain, left side: Secondary | ICD-10-CM

## 2024-06-11 DIAGNOSIS — R059 Cough, unspecified: Secondary | ICD-10-CM | POA: Insufficient documentation

## 2024-06-11 DIAGNOSIS — R8290 Unspecified abnormal findings in urine: Secondary | ICD-10-CM | POA: Diagnosis not present

## 2024-06-11 LAB — POCT URINE DIPSTICK
Bilirubin, UA: NEGATIVE
Glucose, UA: NEGATIVE mg/dL
Nitrite, UA: NEGATIVE
Protein Ur, POC: 100 mg/dL — AB
Spec Grav, UA: 1.02 (ref 1.010–1.025)
Urobilinogen, UA: 0.2 U/dL
pH, UA: 5.5 (ref 5.0–8.0)

## 2024-06-11 MED ORDER — TAMSULOSIN HCL 0.4 MG PO CAPS: 0.4000 mg | ORAL_CAPSULE | Freq: Every day | ORAL | 0 refills | Status: AC

## 2024-06-11 MED ORDER — BENZONATATE 100 MG PO CAPS: 100.0000 mg | ORAL_CAPSULE | Freq: Three times a day (TID) | ORAL | 0 refills | Status: AC | PRN

## 2024-06-11 MED ORDER — SULFAMETHOXAZOLE-TRIMETHOPRIM 800-160 MG PO TABS: 1.0000 | ORAL_TABLET | Freq: Two times a day (BID) | ORAL | 0 refills | Status: AC

## 2024-06-11 NOTE — ED Triage Notes (Signed)
 Per daughter pt has been coughing, left pain, nausea  when breathing x 3 days.

## 2024-06-11 NOTE — Discharge Instructions (Addendum)
 The x-rays of your chest and abdomen are pending.  You will be contacted if the pending test results are abnormal.  You will also have access to the results via MyChart. A urine culture has been ordered.  You will be contacted when the results of the urine culture are received.  If the results of the urine culture are negative and you are continue to experience symptoms, recommend follow-up with your primary care physician for further evaluation. You may continue Tylenol  as needed for pain or discomfort. Increase fluids and allow for plenty of rest. Develop a toileting schedule that will allow you to urinate at least every 2 hours. Avoid caffeine such as tea, soda, or coffee while symptoms persist. If symptoms worsen, with new symptoms of fever, chills, or other concerns, please go to the emergency department for further evaluation. Follow-up with his primary care physician within the next 7 to 10 days for reevaluation. Follow-up as needed.

## 2024-06-11 NOTE — Telephone Encounter (Signed)
 Received the patient's chest x-ray results and KUB results.  Called the patient's daughter to discuss the patient's x-ray results.  Verified patient with 2 patient identifiers.  Patient's daughter was advised that the patient's chest x-ray does show mild bronchitic changes, but no active cardiopulmonary disease to include pneumonia.  With regard to the KUB results, daughter was advised that patient has a suspected kidney stone in the left kidney that most likely is causing his symptoms.  Will treat symptoms with benzonatate  100 mg and tamsulosin 0.4 mg.  The patient's daughter was advised to continue to monitor the patient's pain for worsening, daughter was given strict ER follow-up precautions.  Daughter was also advised to follow-up with the patient's PCP at the [facility] for reevaluation.  Patient's daughter was in agreement with this plan of care and verbalizes understanding.  All questions were answered.

## 2024-06-11 NOTE — ED Provider Notes (Addendum)
 [facility]    CSN: [id number] Arrival date & time: 06/11/24  1104      History   Chief Complaint No chief complaint on file.   HPI Angel Small is a 89 y.o. male.   The history is provided by the patient and a relative.   Patient brought in by his daughter for complaints of cough, left flank pain, and nausea for the past 3 days.  Patient denies fever, chills, headache, ear pain, nasal congestion, runny nose, wheezing, difficulty breathing, abdominal pain, nausea, vomiting, diarrhea, or rash.  The patient states that the area in his left side comes and goes.  States I do not have any pain right now.  States that he does have pain when he presses along the area.  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  Patient denies further urinary symptoms to include frequency, urgency, hematuria, or dysuria.  The patient's daughter states the patient has been engaging in physical therapy over the past 3 weeks.  So far, states he has been taking Tylenol  for his pain.  Past Medical History:  Diagnosis Date   Cancer (HCC)    High cholesterol    Hypertension     There are no active problems to display for this patient.   History reviewed. No pertinent surgical history.     Home Medications    Prior to Admission medications   Medication Sig Start Date End Date Taking? Authorizing Provider  sulfamethoxazole-trimethoprim (BACTRIM DS) 800-160 MG tablet Take 1 tablet by mouth 2 (two) times daily for 7 days. 06/11/24 06/18/24 Yes Leath-Warren, Etta PARAS, NP  acetaminophen  (TYLENOL ) 500 MG tablet Take 500 mg by mouth daily as needed for mild pain or moderate pain.    [provider]  albuterol  (VENTOLIN  HFA) 108 (90 Base) MCG/ACT inhaler Inhale 1-2 puffs into the lungs every 6 (six) hours as needed for wheezing or shortness of breath. 05/19/21   Graham, Laura E, PA-C  amLODipine (NORVASC) 10 MG tablet  Take 10 mg by mouth daily. 03/16/18   [provider]  azelastine (OPTIVAR) 0.05 % ophthalmic solution Apply 1 drop to eye 2 (two) times daily as needed. 05/01/18   [provider]  azithromycin  (ZITHROMAX ) 250 MG tablet Take first 2 tablets together, then 1 every day until finished. 05/14/23   Stuart Vernell Norris, PA-C  benzonatate  (TESSALON ) 100 MG capsule Take 1 capsule (100 mg total) by mouth every 8 (eight) hours. 05/19/21   Graham, Laura E, PA-C  benzonatate  (TESSALON ) 100 MG capsule Take 1 capsule (100 mg total) by mouth every 8 (eight) hours. 05/14/23   Stuart Vernell Norris, PA-C  benzonatate  (TESSALON ) 200 MG capsule Take 1 capsule (200 mg total) by mouth 3 (three) times daily as needed for cough. 09/08/23   Stuart Vernell Norris, PA-C  cholecalciferol (VITAMIN D) 1000 UNITS tablet Take 1,000 Units by mouth every morning.    [provider]  fluticasone  (FLONASE ) 50 MCG/ACT nasal spray Place 1 spray into both nostrils daily for 14 days. 08/11/19 08/25/19  Avegno, Komlanvi S, FNP  folic acid (FOLVITE) 800 MCG tablet Take 800 mcg by mouth every morning.    [provider]  guaiFENesin  (MUCINEX ) 600 MG 12 hr tablet Take 1 tablet (600 mg total) by mouth 2 (two) times daily as needed. 05/14/23   Stuart Vernell Norris, PA-C  guaiFENesin  (MUCINEX ) 600 MG 12 hr tablet Take 1 tablet (600  mg total) by mouth 2 (two) times daily. 09/08/23   Stuart Vernell Norris, PA-C  hydrochlorothiazide (HYDRODIURIL) 25 MG tablet Take 25 mg by mouth daily. 05/04/18   [provider]  Multiple Vitamins-Minerals (ICAPS) TABS Take 1 tablet by mouth 2 (two) times daily.    [provider]  ondansetron  (ZOFRAN  ODT) 8 MG disintegrating tablet Take 1 tablet (8 mg total) by mouth every 8 (eight) hours as needed for nausea or vomiting. 05/19/21   Graham, Laura E, PA-C  oseltamivir  (TAMIFLU ) 75 MG capsule Take 1 capsule (75 mg total) by mouth every 12 (twelve) hours. 05/19/21    Graham, Laura E, PA-C  predniSONE  (DELTASONE ) 20 MG tablet Take 2 tablets (40 mg total) by mouth daily with breakfast. 09/08/23   Stuart Vernell Norris, PA-C  sertraline (ZOLOFT) 50 MG tablet Take 50 mg by mouth daily. 05/29/18   [provider]  simvastatin (ZOCOR) 20 MG tablet Take 20 mg by mouth every evening.    [provider]  tamsulosin  (FLOMAX ) 0.4 MG CAPS capsule Take 1 capsule (0.4 mg total) by mouth daily after supper. 06/11/24   Leath-Warren, Etta PARAS, NP    Family History Family History  Problem Relation Age of Onset   Healthy Mother    Healthy Father     Social History Social History   Tobacco Use   Smoking status: Former   Smokeless tobacco: Never  Substance Use Topics   Alcohol use: Not Currently   Drug use: Never     Allergies   Penicillins   Review of Systems Review of Systems Per HPI  Physical Exam Triage Vital Signs ED Triage Vitals  Encounter Vitals Group     BP 06/11/24 1124 (!) 137/55     Girls Systolic BP Percentile --      Girls Diastolic BP Percentile --      Boys Systolic BP Percentile --      Boys Diastolic BP Percentile --      Pulse Rate 06/11/24 1124 96     Resp --      Temp 06/11/24 1124 98.5 F (36.9 C)     Temp src --      SpO2 06/11/24 1124 94 %     Weight --      Height --      Head Circumference --      Peak Flow --      Pain Score 06/11/24 1129 4     Pain Loc --      Pain Education --      Exclude from Growth Chart --    No data found.  Updated Vital Signs BP (!) 137/55 (BP Location: Right Arm)   Pulse 96   Temp 98.5 F (36.9 C)   Resp 18   SpO2 94%   Visual Acuity Right Eye Distance:   Left Eye Distance:   Bilateral Distance:    Right Eye Near:   Left Eye Near:    Bilateral Near:     Physical Exam Vitals and nursing note reviewed.  Constitutional:      General: He is not in acute distress.    Appearance: Normal appearance.  HENT:     Head: Normocephalic.     Right Ear:  Tympanic membrane, ear canal and external ear normal.     Left Ear: Tympanic membrane, ear canal and external ear normal.     Nose: Nose normal.     Mouth/Throat:     Mouth: Mucous membranes  are moist.  Eyes:     Extraocular Movements: Extraocular movements intact.     Pupils: Pupils are equal, round, and reactive to light.  Cardiovascular:     Rate and Rhythm: Normal rate and regular rhythm.     Pulses: Normal pulses.     Heart sounds: Normal heart sounds.  Pulmonary:     Effort: Pulmonary effort is normal. No respiratory distress.     Breath sounds: Normal breath sounds. No stridor. No wheezing, rhonchi or rales.  Abdominal:     General: Bowel sounds are normal.     Palpations: Abdomen is soft.  Musculoskeletal:     Cervical back: Normal range of motion.       Back:  Skin:    General: Skin is warm and dry.  Neurological:     General: No focal deficit present.     Mental Status: He is alert and oriented to person, place, and time.  Psychiatric:        Mood and Affect: Mood normal.        Behavior: Behavior normal.      UC Treatments / Results  Labs (all labs ordered are listed, but only abnormal results are displayed) Labs Reviewed  POCT URINE DIPSTICK - Abnormal; Notable for the following components:      Result Value   Color, UA brown (*)    Clarity, UA turbid (*)    Ketones, POC UA small (15) (*)    Blood, UA large (*)    Protein Ur, POC =100 (*)    Leukocytes, UA Small (1+) (*)    All other components within normal limits  URINE CULTURE    EKG   Radiology DG Chest 2 View Result Date: 06/11/2024 CLINICAL DATA:  Cough. EXAM: DG CHEST 2V COMPARISON:  06/21/2018. FINDINGS: The heart size and mediastinal contours are within normal limits. Left chest wall dual lead pacemaker. Chronic bronchitic changes. No focal consolidation, pleural effusion, or pneumothorax. Mild bibasilar linear atelectasis/scarring. Diffuse osteopenia. No acute osseous abnormality.  IMPRESSION: Chronic bronchitic changes with mild bibasilar linear atelectasis/scarring. Otherwise, no acute cardiopulmonary findings. Electronically Signed   By: Harrietta Sherry M.D.   On: 06/11/2024 13:43   DG Abdomen 1 View Result Date: 06/11/2024 CLINICAL DATA:  Left flank pain. EXAM: ABDOMEN - 1 VIEW COMPARISON:  None Available. FINDINGS: Nonobstructive bowel-gas pattern. Moderate volume of stool throughout the colon. Small radiopaque densities projecting over the expected level of the inferior pole of the left kidney measuring up to 5 mm may reflect renal calculi. Degenerative changes of the lumbar spine. IMPRESSION: 1. Small radiopaque densities projecting over the expected level of the inferior pole of the left kidney measuring up to 5 mm may reflect renal calculi. 2. Nonobstructive bowel-gas pattern. Moderate volume of stool throughout the colon. Electronically Signed   By: Harrietta Sherry M.D.   On: 06/11/2024 13:39    Procedures Procedures (including critical care time)  Medications Ordered in UC Medications - No data to display  Initial Impression / Assessment and Plan / UC Course  I have reviewed the triage vital signs and the nursing notes.  Pertinent labs & imaging results that were available during my care of the patient were reviewed by me and considered in my medical decision making (see chart for details).  Awaiting chest x-ray results and KUB results.  Urinalysis is suggestive of a UTI to include leukocytes, protein, and blood.  Urine culture is pending.  In the interim, we will treat with  Bactrim  DS 800/160 mg (reviewed patient's most recent lab work dated 02/04/2024, creatinine was 0.8, GFR 82).  Also cannot rule out kidney stone at this time.  Patient's cough also consistent with viral etiology.  Will treat with benzonatate  100 mg.  Supportive care recommendations were provided and discussed with the patient and his daughter to include continuing over-the-counter analgesics,  fluids, rest, and to monitor for worsening symptoms.  Discussed indications with the patient's daughter regarding follow-up.  Patient's daughter was in agreement with this plan of care and verbalizes understanding.  All questions were answered.  Patient stable for discharge.   Final Clinical Impressions(s) / UC Diagnoses   Final diagnoses:  Left flank pain  Cough, unspecified type  Abnormal urinalysis     Discharge Instructions      The x-rays of your chest and abdomen are pending.  You will be contacted if the pending test results are abnormal.  You will also have access to the results via MyChart. A urine culture has been ordered.  You will be contacted when the results of the urine culture are received.  If the results of the urine culture are negative and you are continue to experience symptoms, recommend follow-up with your primary care physician for further evaluation. You may continue Tylenol  as needed for pain or discomfort. Increase fluids and allow for plenty of rest. Develop a toileting schedule that will allow you to urinate at least every 2 hours. Avoid caffeine such as tea, soda, or coffee while symptoms persist. If symptoms worsen, with new symptoms of fever, chills, or other concerns, please go to the emergency department for further evaluation. Follow-up with his primary care physician within the next 7 to 10 days for reevaluation. Follow-up as needed.     ED Prescriptions     Medication Sig Dispense Auth. Provider   sulfamethoxazole -trimethoprim  (BACTRIM  DS) 800-160 MG tablet Take 1 tablet by mouth 2 (two) times daily for 7 days. 14 tablet Leath-Warren, Etta PARAS, NP      PDMP not reviewed this encounter.   Gilmer Etta PARAS, NP 06/11/24 1309    Gilmer Etta PARAS, NP 06/11/24 1441

## 2024-06-12 ENCOUNTER — Ambulatory Visit (HOSPITAL_COMMUNITY): Payer: Self-pay

## 2024-06-13 LAB — URINE CULTURE: Culture: NO GROWTH
